# Patient Record
Sex: Male | Born: 1980 | Race: White | Hispanic: No | Marital: Married | Smoking: Heavy tobacco smoker
Health system: Southern US, Community
[De-identification: ages and names within clinical notes are randomized; demographics above are authoritative.]

---

## 2020-05-19 ENCOUNTER — Emergency Department
Admission: EM | Admit: 2020-05-19 | Discharge: 2020-05-19 | Disposition: A | Discharge status: Home / Self Care | Payer: Managed Care, Other (non HMO) | Attending: Emergency Medicine | Admitting: Emergency Medicine

## 2020-05-19 ENCOUNTER — Other Ambulatory Visit: Payer: Self-pay

## 2020-05-19 ENCOUNTER — Emergency Department: Payer: Managed Care, Other (non HMO)

## 2020-05-19 DIAGNOSIS — R29810 Facial weakness: Secondary | ICD-10-CM | POA: Diagnosis present

## 2020-05-19 DIAGNOSIS — F17200 Nicotine dependence, unspecified, uncomplicated: Secondary | ICD-10-CM | POA: Diagnosis not present

## 2020-05-19 DIAGNOSIS — G510 Bell's palsy: Secondary | ICD-10-CM

## 2020-05-19 LAB — COMPREHENSIVE METABOLIC PANEL
ALT: 28 U/L (ref 0–44)
AST: 21 U/L (ref 15–41)
Albumin: 4.2 g/dL (ref 3.5–5.0)
Alkaline Phosphatase: 63 U/L (ref 38–126)
Anion gap: 12 (ref 5–15)
BUN: 15 mg/dL (ref 6–20)
CO2: 26 mmol/L (ref 22–32)
Calcium: 9.4 mg/dL (ref 8.9–10.3)
Chloride: 102 mmol/L (ref 98–111)
Creatinine, Ser: 1.06 mg/dL (ref 0.61–1.24)
GFR, Estimated: >60 mL/min (ref >60)
Glucose, Bld: 115 mg/dL — ABNORMAL HIGH (ref 70–99)
Potassium: 3.8 mmol/L (ref 3.5–5.1)
Sodium: 140 mmol/L (ref 135–145)
Total Bilirubin: 0.4 mg/dL (ref 0.3–1.2)
Total Protein: 7.9 g/dL (ref 6.5–8.1)

## 2020-05-19 LAB — CBC
HCT: 43.1 % (ref 39.0–52.0)
Hemoglobin: 14.8 g/dL (ref 13.0–17.0)
MCH: 28.2 pg (ref 26.0–34.0)
MCHC: 34.3 g/dL (ref 30.0–36.0)
MCV: 82.1 fL (ref 80.0–100.0)
Platelets: 280 10*3/uL (ref 150–400)
RBC: 5.25 MIL/uL (ref 4.22–5.81)
RDW: 12.7 % (ref 11.5–15.5)
WBC: 11.5 10*3/uL — ABNORMAL HIGH (ref 4.0–10.5)
nRBC: 0 % (ref 0.0–0.2)

## 2020-05-19 MED ORDER — PREDNISONE 20 MG PO TABS: 60.0000 mg | ORAL_TABLET | Freq: Every day | ORAL | 0 refills | Status: AC

## 2020-05-19 MED ORDER — ACYCLOVIR 400 MG PO TABS: 400.0000 mg | ORAL_TABLET | Freq: Every day | ORAL | 0 refills | Status: AC

## 2020-05-19 MED ORDER — ERYTHROMYCIN 5 MG/GM OP OINT: 1.0000 "application " | TOPICAL_OINTMENT | Freq: Every day | OPHTHALMIC | 0 refills | Status: AC

## 2020-05-19 MED ORDER — IBUPROFEN 600 MG PO TABS: 600.0000 mg | ORAL_TABLET | Freq: Four times a day (QID) | ORAL | 0 refills | Status: AC | PRN

## 2020-05-19 NOTE — ED Triage Notes (Addendum)
Reports left sided facial paralysis that he noticed this morning while eating breakfast, progressing throughout the day and more noticeable at lunch approx 3 hours ago. Pt reports tenderness/throbbing behind left ear over weekend and continuing today. Pt states he was unable to keep food/drink in left side of mouth today. Unable to raise left eyebrow and close left eyelid at time of triage. States "bright light" vision to left eye only. No extremity weakness noted. Speech clear and equal. Decreased sensation to left side of face. Ambulates without difficulty.

## 2020-05-19 NOTE — ED Notes (Signed)
Spoke with Dr. Cyril Loosen regarding pt clinical presentation. Orders received.

## 2020-05-19 NOTE — ED Provider Notes (Signed)
[facility] Emergency Department Provider Note  ____________________________________________   Event Date/Time   First MD Initiated Contact with Patient 05/19/20 1659     (approximate)  I have reviewed the triage vital signs and the nursing notes.   HISTORY  Chief Complaint Facial Paralysis    HPI Michael Cherry is a 40 y.o. male who is otherwise healthy other than being on CPAP at nighttime who comes in for facial paralysis.  Patient had left-sided facial paralysis that he noticed this morning .  He is unable to raise left eyebrow and close the left eye.  His symptoms are moderate, constant, nothing makes better, nothing makes it worse.  States that 2 days prior to this he had some pain on the left side of his face but was not today that he could not move that side.  He is unable to move the left forehead.  Patient uses a CPAP machine.  Denies prior stroke.  Denies any tick bites.          History reviewed. No pertinent past medical history.  There are no problems to display for this patient.   History reviewed. No pertinent surgical history.  Prior to Admission medications   Not on File    Allergies Bee venom  No family history on file.  Social History Social History   Tobacco Use  . Smoking status: Heavy Tobacco Smoker  . Smokeless tobacco: Current User    Types: Chew  Substance Use Topics  . Alcohol use: Yes      Review of Systems Constitutional: No fever/chills Eyes: No visual changes. ENT: No sore throat. Cardiovascular: Denies chest pain. Respiratory: Denies shortness of breath. Gastrointestinal: No abdominal pain.  No nausea, no vomiting.  No diarrhea.  No constipation. Genitourinary: Negative for dysuria. Musculoskeletal: Negative for back pain. Skin: Negative for rash. Neurological: Facial asymmetry All other ROS negative ____________________________________________   PHYSICAL EXAM:  VITAL SIGNS: ED Triage  Vitals  Enc Vitals Group     BP 05/19/20 1441 123/73     Pulse Rate 05/19/20 1441 78     Resp 05/19/20 1441 18     Temp 05/19/20 1441 98.4 F (36.9 C)     Temp Source 05/19/20 1441 Oral     SpO2 05/19/20 1441 100 %     Weight 05/19/20 1449 270 lb (122.5 kg)     Height 05/19/20 1449 5\' 7"  (1.702 m)     Head Circumference --      Peak Flow --      Pain Score 05/19/20 1449 4     Pain Loc --      Pain Edu? --      Excl. in GC? --     Constitutional: Alert and oriented. Well appearing and in no acute distress. Eyes: Conjunctivae are normal. EOMI. vision 20/15 bilaterally.  No rash noted over the eyes Head: Atraumatic. Nose: No congestion/rhinnorhea. Mouth/Throat: Mucous membranes are moist.  TMs are clear.  No rash over the ears.   Neck: No stridor. Trachea Midline. FROM Cardiovascular: Normal rate, regular rhythm. Grossly normal heart sounds.  Good peripheral circulation. Respiratory: Normal respiratory effort.  No retractions. Lungs CTAB. Gastrointestinal: Soft and nontender. No distention. No abdominal bruits.  Musculoskeletal: No lower extremity tenderness nor edema.  No joint effusions. Neurologic: Patient unable to wrinkle the left side of forehead.  Unable to lift the left eyebrow.  He has facial droop on the left.  Unable to close the left eye tightly.  Equal strength in arms and legs.  Sensation intact in the rest of his body.  Decreased sensation on the left side of his face. Skin:  Skin is warm, dry and intact. No rash noted. Psychiatric: Mood and affect are normal. Speech and behavior are normal. GU: Deferred   ____________________________________________   LABS (all labs ordered are listed, but only abnormal results are displayed)  Labs Reviewed  CBC - Abnormal; Notable for the following components:      Result Value   WBC 11.5 (*)    All other components within normal limits  COMPREHENSIVE METABOLIC PANEL - Abnormal; Notable for the following components:    Glucose, Bld 115 (*)    All other components within normal limits   ____________________________________________   RADIOLOGY  Official radiology report(s): CT Head Wo Contrast  Result Date: 05/19/2020 CLINICAL DATA:  Left-sided facial paralysis EXAM: CT HEAD WITHOUT CONTRAST TECHNIQUE: Contiguous axial images were obtained from the base of the skull through the vertex without intravenous contrast. COMPARISON:  None. FINDINGS: Brain: There is no acute intracranial hemorrhage, mass effect, or edema. Gray-white differentiation is preserved. There is no extra-axial fluid collection. Ventricles and sulci are within normal limits in size and configuration. Vascular: No hyperdense vessel or unexpected calcification. Skull: Calvarium is unremarkable. Sinuses/Orbits: No acute finding. Other: None. IMPRESSION: No acute intracranial abnormality. Electronically Signed   By: Guadlupe Spanish M.D.   On: 05/19/2020 15:27    ____________________________________________   PROCEDURES  Procedure(s) performed (including Critical Care):  Procedures   ____________________________________________   INITIAL IMPRESSION / ASSESSMENT AND PLAN / ED COURSE  Michael Cherry was evaluated in Emergency Department on 05/19/2020 for the symptoms described in the history of present illness. He was evaluated in the context of the global COVID-19 pandemic, which necessitated consideration that the patient might be at risk for infection with the SARS-CoV-2 virus that causes COVID-19. Institutional protocols and algorithms that pertain to the evaluation of patients at risk for COVID-19 are in a state of rapid change based on information released by regulatory bodies including the CDC and federal and state organizations. These policies and algorithms were followed during the patient's care in the ED.    Patient is a well-appearing 40 year old who comes in with left-sided facial paralysis.  Patient is unable to wrinkle the left side  of his forehead.  This is consistent with Bell's palsy given he is unable to move the left side of his forehead.  If there was forehead sparing this would be more concerning for stroke.  CT head was ordered in triage for did not show evidence of mass or evidence  stroke or hemorrhage. given he does not have forehead sparing I do not think he needs MRI.  TM examination did not show any evidence of Ramsay Hunt syndrome.  Discussed with patient cornea eye protection, steroids, antivirals and will give him follow-up with ophthalmology.  Patient is almost able to close his eye completely but will prescribe erythromycin omit to use at night, artificial tears to use during the day, and explained that he needs to get tape to help shut the eye and explain the importance of getting eye doctor evaluation within the week due to the high risk for having an abrasion.  Patient expressed understanding felt comfortable with discharge       ____________________________________________   FINAL CLINICAL IMPRESSION(S) / ED DIAGNOSES   Final diagnoses:  Bell's palsy      MEDICATIONS GIVEN DURING THIS VISIT:  Medications - No  data to display   ED Discharge Orders         Ordered    erythromycin ophthalmic ointment  Daily at bedtime        05/19/20 1727    predniSONE (DELTASONE) 20 MG tablet  Daily with breakfast        05/19/20 1727    acyclovir (ZOVIRAX) 400 MG tablet  5 times daily        05/19/20 1727    ibuprofen (ADVIL) 600 MG tablet  Every 6 hours PRN        05/19/20 1728           Note:  This document was prepared using Dragon voice recognition software and may include unintentional dictation errors.   Concha Se, MD 05/19/20 [phone]

## 2020-05-19 NOTE — Discharge Instructions (Addendum)
For your eyes: Artificial tears every hour while patient is awake- you can get this over the counter  Ophthalmic ointment at night Eye should be taped shut at night Protective glasses or goggles Call eye doctor to schedule appointment  Take steroids and anti virals. Take ibuprofen with food. Tylenol 1g every 8 hours.  Return to Er for worsening symptoms or any other concerns.

## 2021-08-26 IMAGING — CT CT HEAD W/O CM
3 series · 15 of 47 positions shown, 18 images · non-contrast
Comparison: None.

CLINICAL DATA: Left-sided facial paralysis

EXAM:
CT HEAD WITHOUT CONTRAST
TECHNIQUE: Contiguous axial images were obtained from the base of the skull
through the vertex without intravenous contrast.

[Series 2: head wo · axial · 0.47mm/px · z∈[-132,+3]mm · 9 of 33 slices shown, 12 images]
[im 3/33  brain]
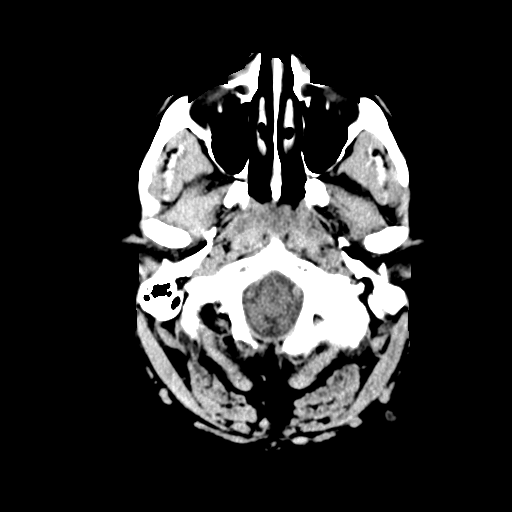
[im 3/33  bone]
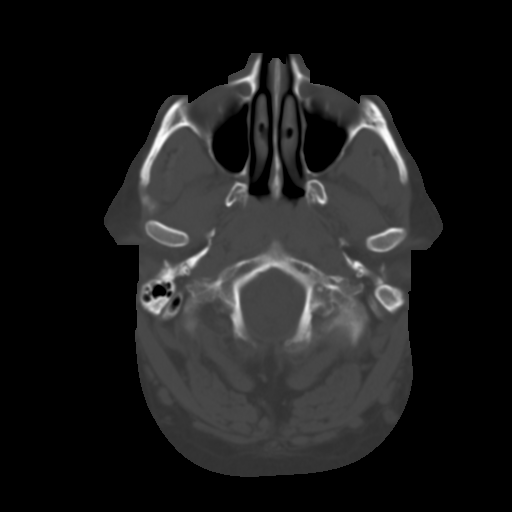
[im 6/33  brain]
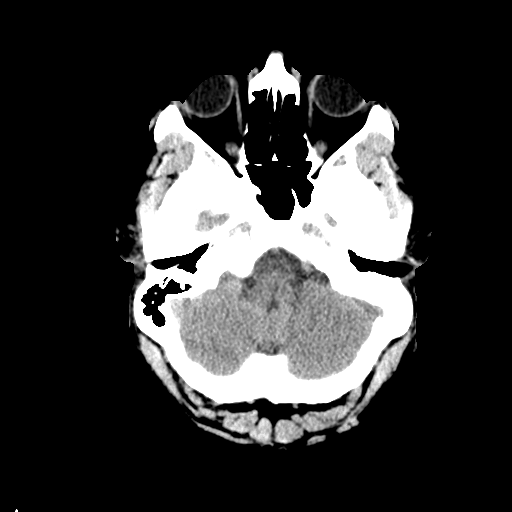
[im 9/33  brain]
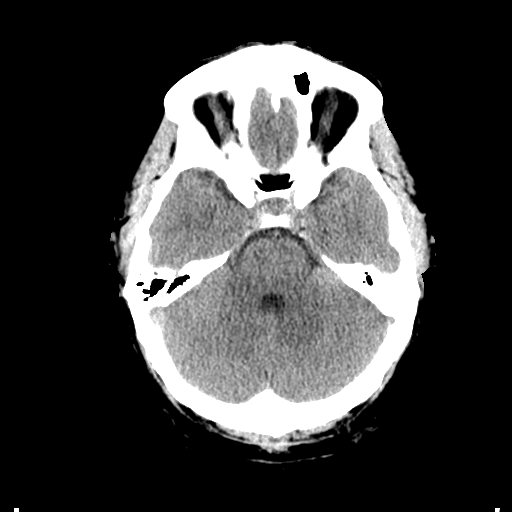
[im 13/33  brain]
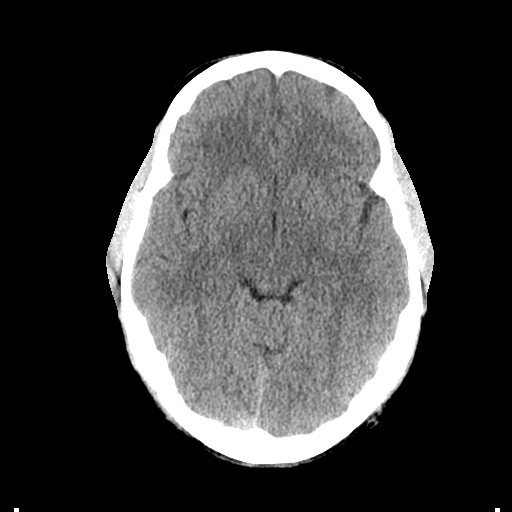
[im 17/33  brain]
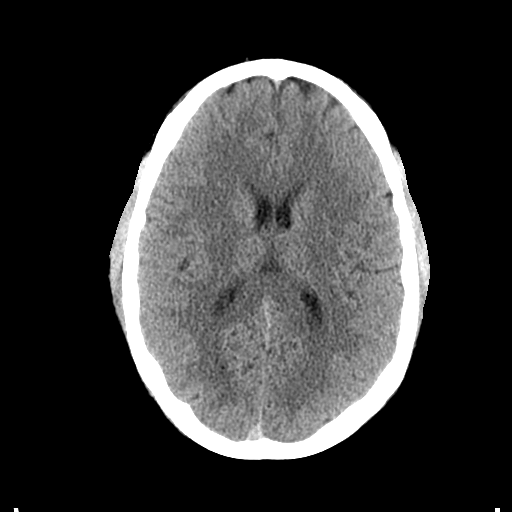
[im 17/33  bone]
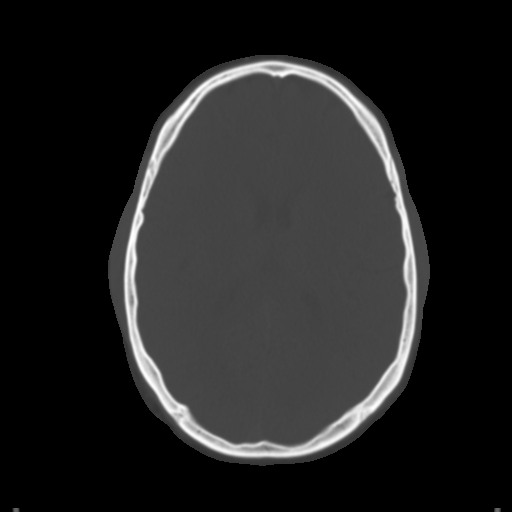
[im 20/33  brain]
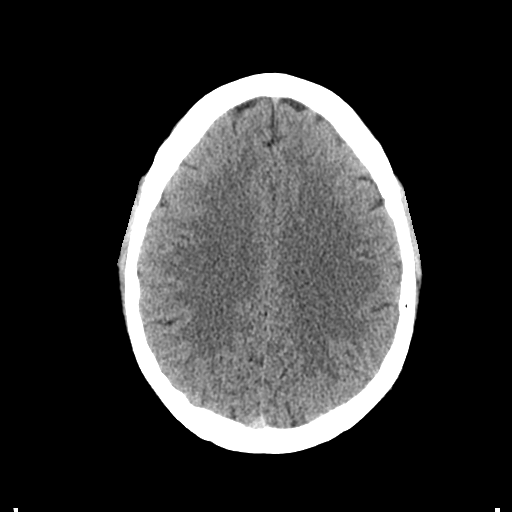
[im 24/33  brain]
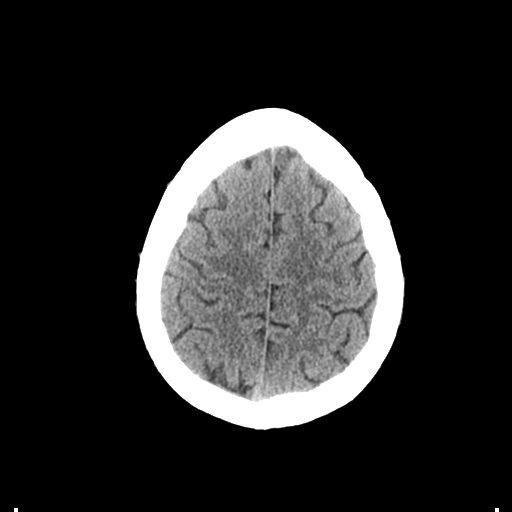
[im 27/33  brain]
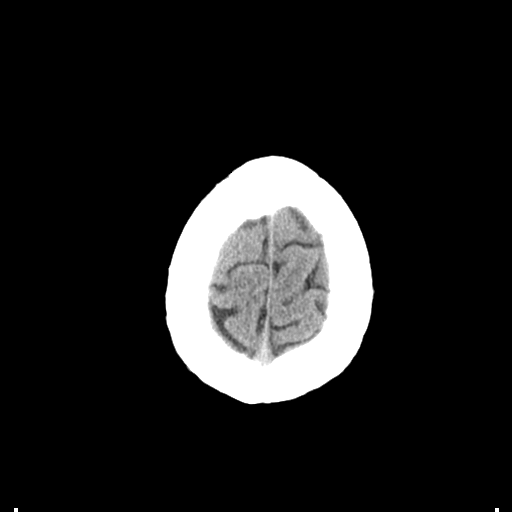
[im 30/33  brain]
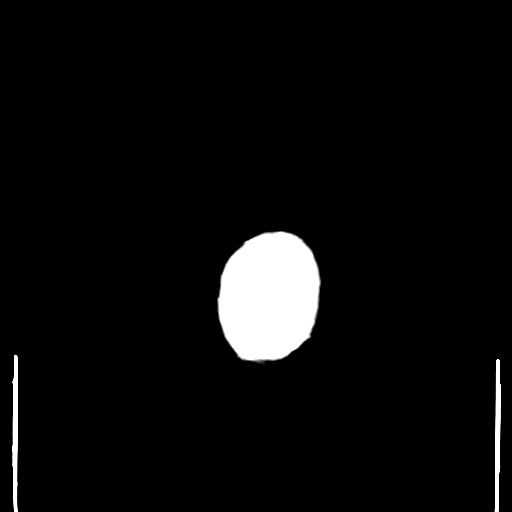
[im 30/33  bone]
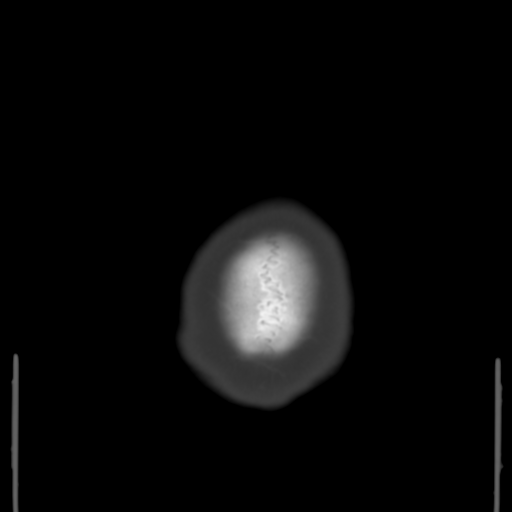

[Series 4: coronal soft tissue · coronal · 0.32mm/px · 3 of 76 slices shown]
[im 26/76  brain]
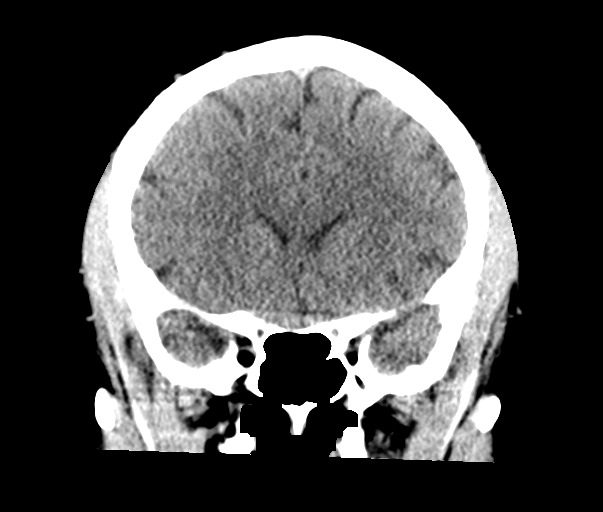
[im 34/76  brain]
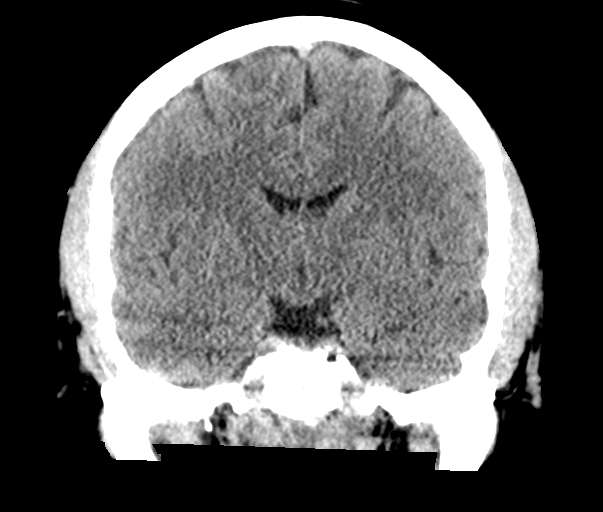
[im 42/76  brain]
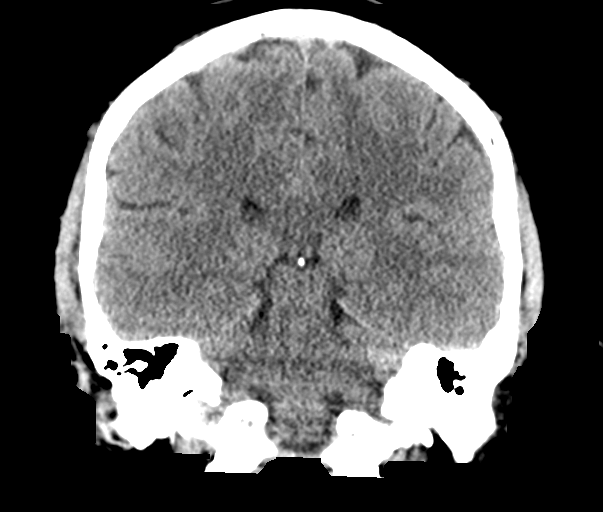

[Series 5: sagittal soft tissue · sagittal · 0.35mm/px · 3 of 62 slices shown]
[im 21/62  brain]
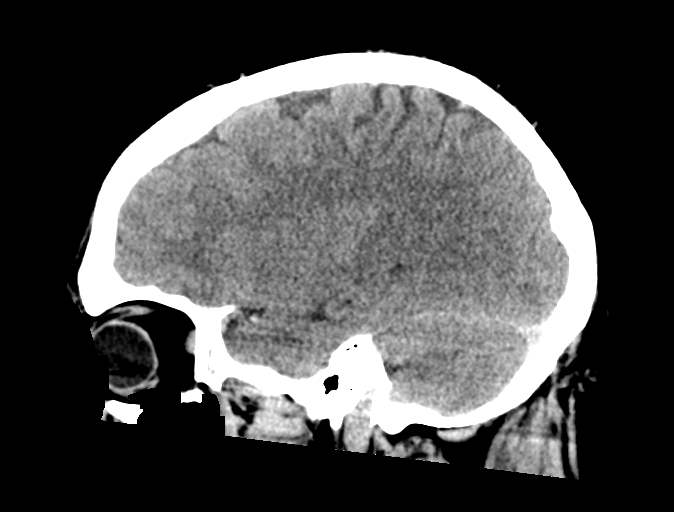
[im 31/62  brain]
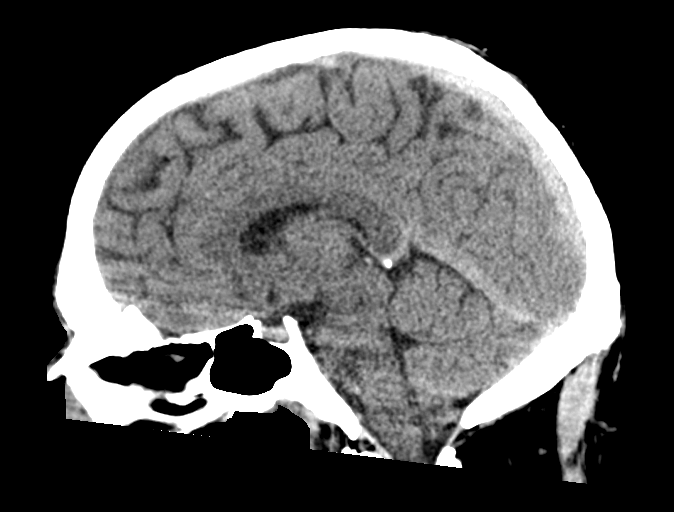
[im 41/62  brain]
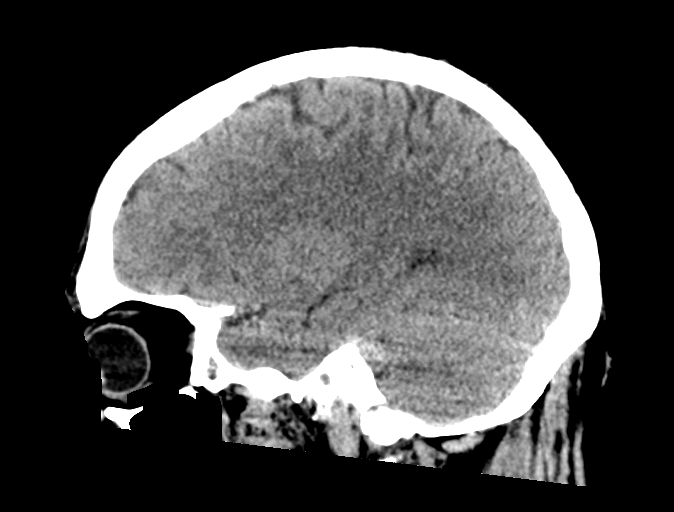

[15 of 47 positions shown; findings below may reference images not displayed]

FINDINGS: Brain: There is no acute intracranial hemorrhage, mass effect, or
edema. Gray-white differentiation is preserved. There is no
extra-axial fluid collection. Ventricles and sulci are within normal
limits in size and configuration.

Vascular: No hyperdense vessel or unexpected calcification.

Skull: Calvarium is unremarkable.

Sinuses/Orbits: No acute finding.

Other: None.
IMPRESSION: No acute intracranial abnormality.
